# Patient Record
Sex: Male | Born: 1990 | Race: White | Hispanic: No | Marital: Single | Smoking: Current some day smoker
Health system: Southern US, Community
[De-identification: ages and names within clinical notes are randomized; demographics above are authoritative.]

## PROBLEM LIST (undated history)

## (undated) DIAGNOSIS — G8929 Other chronic pain: Secondary | ICD-10-CM

## (undated) DIAGNOSIS — M549 Dorsalgia, unspecified: Secondary | ICD-10-CM

## (undated) DIAGNOSIS — M419 Scoliosis, unspecified: Secondary | ICD-10-CM

## (undated) DIAGNOSIS — G009 Bacterial meningitis, unspecified: Secondary | ICD-10-CM

---

## 2013-09-06 ENCOUNTER — Emergency Department (HOSPITAL_BASED_OUTPATIENT_CLINIC_OR_DEPARTMENT_OTHER)
Admission: EM | Admit: 2013-09-06 | Discharge: 2013-09-07 | Disposition: A | Discharge status: Home / Self Care | Payer: BC Managed Care – PPO | Attending: Emergency Medicine | Admitting: Emergency Medicine

## 2013-09-06 ENCOUNTER — Encounter (HOSPITAL_BASED_OUTPATIENT_CLINIC_OR_DEPARTMENT_OTHER): Payer: Self-pay | Admitting: Emergency Medicine

## 2013-09-06 DIAGNOSIS — R5383 Other fatigue: Secondary | ICD-10-CM

## 2013-09-06 DIAGNOSIS — F17200 Nicotine dependence, unspecified, uncomplicated: Secondary | ICD-10-CM | POA: Insufficient documentation

## 2013-09-06 DIAGNOSIS — G8929 Other chronic pain: Secondary | ICD-10-CM | POA: Insufficient documentation

## 2013-09-06 DIAGNOSIS — R5381 Other malaise: Secondary | ICD-10-CM | POA: Insufficient documentation

## 2013-09-06 DIAGNOSIS — R42 Dizziness and giddiness: Secondary | ICD-10-CM | POA: Insufficient documentation

## 2013-09-06 DIAGNOSIS — R630 Anorexia: Secondary | ICD-10-CM | POA: Insufficient documentation

## 2013-09-06 DIAGNOSIS — R0602 Shortness of breath: Secondary | ICD-10-CM | POA: Insufficient documentation

## 2013-09-06 DIAGNOSIS — H538 Other visual disturbances: Secondary | ICD-10-CM | POA: Insufficient documentation

## 2013-09-06 DIAGNOSIS — Z880 Allergy status to penicillin: Secondary | ICD-10-CM | POA: Insufficient documentation

## 2013-09-06 DIAGNOSIS — G4489 Other headache syndrome: Secondary | ICD-10-CM | POA: Insufficient documentation

## 2013-09-06 DIAGNOSIS — R55 Syncope and collapse: Secondary | ICD-10-CM | POA: Insufficient documentation

## 2013-09-06 HISTORY — DX: Scoliosis, unspecified: M41.9

## 2013-09-06 HISTORY — DX: Other chronic pain: G89.29

## 2013-09-06 HISTORY — DX: Dorsalgia, unspecified: M54.9

## 2013-09-06 LAB — URINALYSIS, ROUTINE W REFLEX MICROSCOPIC
Bilirubin Urine: NEGATIVE
GLUCOSE, UA: NEGATIVE mg/dL
HGB URINE DIPSTICK: NEGATIVE
Ketones, ur: NEGATIVE mg/dL
Leukocytes, UA: NEGATIVE
Nitrite: NEGATIVE
PH: 6 (ref 5.0–8.0)
PROTEIN: NEGATIVE mg/dL
Specific Gravity, Urine: 1.027 (ref 1.005–1.030)
Urobilinogen, UA: 0.2 mg/dL (ref 0.0–1.0)

## 2013-09-06 NOTE — ED Provider Notes (Signed)
CSN: [id number]     Arrival date & time 09/06/13  2105 History   None    This chart was scribed for Joya Gaskins, MD by Arlan Organ, ED Scribe. This patient was seen in room MH07/MH07 and the patient's care was started 11:02 PM.   Chief Complaint  Patient presents with  . Fatigue  . Near Syncope   The history is provided by the patient. No language interpreter was used.    HPI Comments: Calvin Ray is a 23 y.o. male with a PMHx of chronic back pain and scoliosis who presents to the Emergency Department complaining of fatigue onset today. He reports near syncope while at work earlier today. Pt also mentions an ongoing HA that is baseline for him, foggy vision, mild SOB, "black out spells",  lightheadedness, and weakness. Pt states "i feel like my body is out of place". States "it feels like I am on LSD but im not". Last known well this morning around 9 AM. He denies any hallucinations. No fever. Pt admits to smoking Marijuana. Last use yesterday. He denies any other illicit drug use. Pt with known allergy to Amoxicillin. No other concerns this visit.  Past Medical History  Diagnosis Date  . Scoliosis   . Chronic back pain    History reviewed. No pertinent past surgical history. History reviewed. No pertinent family history. History  Substance Use Topics  . Smoking status: Current Some Day Smoker  . Smokeless tobacco: Not on file  . Alcohol Use: No    Review of Systems  Constitutional: Positive for fatigue. Negative for fever.  Eyes: Positive for visual disturbance.  Respiratory: Positive for shortness of breath.   Neurological: Positive for dizziness, syncope, weakness, light-headedness and headaches.  Psychiatric/Behavioral: Negative for hallucinations.  All other systems reviewed and are negative.     Allergies  Amoxicillin  Home Medications   Prior to Admission medications   Not on File   Triage Vitals: BP 116/68  Pulse 77  Temp(Src) 98.3 F (36.8 C)  (Oral)  Resp 16  Wt 210 lb (95.255 kg)  SpO2 100%   Physical Exam  CONSTITUTIONAL: Well developed/well nourished HEAD: Normocephalic/atraumatic EYES: EOMI/PERRL ENMT: Mucous membranes moist NECK: supple no meningeal signs SPINE:entire spine nontender CV: S1/S2 noted, no murmurs/rubs/gallops noted LUNGS: Lungs are clear to auscultation bilaterally, no apparent distress ABDOMEN: soft, nontender, no rebound or guarding GU:no cva tenderness NEURO: Pt is awake/alert, moves all extremitiesx4. No arm or leg drift, no facial droop. Pt is ambulatory.  Speech is fluent.  No ataxia.  He answers all questions appropriately EXTREMITIES: pulses normal, full ROM SKIN: warm, color normal PSYCH: no abnormalities of mood noted   ED Course  Procedures  DIAGNOSTIC STUDIES: Oxygen Saturation is 100% on RA, Normal by my interpretation.    COORDINATION OF CARE: 11:02 PM- Will order urinalysis. Discussed treatment plan with pt at bedside and pt agreed to plan.    pt well appearing.  He had multiple complaints, and he feels it may stem from his THC use.  I advised to stop all drug use.  He has no focal neuro  Deficits, he is ambulatory without difficulty I also advised need to perform further labs (CBC/BMP) due to fatigue, but he refuses He was given outpatient referral He reports he has chronic numbness in his hands/feet that are not new and reports extensive workup previously without clear diagnosis   Labs Review Labs Reviewed  URINALYSIS, ROUTINE W REFLEX MICROSCOPIC    Imaging [id number]  Ct Head Wo Contrast  09/07/2013   CLINICAL DATA:  headache  EXAM: CT HEAD WITHOUT CONTRAST  TECHNIQUE: Contiguous axial images were obtained from the base of the skull through the vertex without intravenous contrast.  COMPARISON:  None.  FINDINGS: Mild atrophy. There is no evidence of acute intracranial hemorrhage, brain edema, mass lesion, acute infarction, mass effect, or midline shift. Acute infarct may be  inapparent on noncontrast CT. No other intra-axial abnormalities are seen, and the ventricles and sulci are within normal limits in size and symmetry. No abnormal extra-axial fluid collections or masses are identified. No significant calvarial abnormality.  IMPRESSION: 1. Negative for bleed or other acute intracranial process.   Electronically Signed   By: Oley Balm M.D.   On: 09/07/2013 00:17     EKG Interpretation   Date/Time:  Thursday September 06 2013 23:16:15 EDT Ventricular Rate:  61 PR Interval:  138 QRS Duration: 96 QT Interval:  420 QTC Calculation: 422 R Axis:   79 Text Interpretation:  Normal sinus rhythm with sinus arrhythmia Normal ECG  No previous ECGs available Confirmed by Bebe Shaggy  MD, Dorinda Hill ([id number]) on  09/06/2013 11:55:49 PM      MDM   Final diagnoses:  Other fatigue  Near syncope  Other headache syndrome    Nursing notes including past medical history and social history reviewed and considered in documentation Labs/vital reviewed and considered   I personally performed the services described in this documentation, which was scribed in my presence. The recorded information has been reviewed and is accurate.    Joya Gaskins, MD 09/07/13 [phone]

## 2013-09-06 NOTE — ED Notes (Signed)
Patient states that he has felt "foggy" today and just tired. Almost passed out at work earlier.

## 2013-09-06 NOTE — ED Notes (Signed)
Pt states feels like in a fog, near syncope several times today,   Feels numb

## 2013-09-07 ENCOUNTER — Emergency Department (HOSPITAL_BASED_OUTPATIENT_CLINIC_OR_DEPARTMENT_OTHER): Payer: BC Managed Care – PPO

## 2013-09-07 NOTE — Discharge Instructions (Signed)

## 2014-03-01 ENCOUNTER — Encounter (HOSPITAL_COMMUNITY): Payer: Self-pay | Admitting: Emergency Medicine

## 2014-03-01 ENCOUNTER — Emergency Department (HOSPITAL_COMMUNITY)
Admission: EM | Admit: 2014-03-01 | Discharge: 2014-03-02 | Disposition: A | Discharge status: Home / Self Care | Payer: Self-pay | Attending: Emergency Medicine | Admitting: Emergency Medicine

## 2014-03-01 DIAGNOSIS — Z720 Tobacco use: Secondary | ICD-10-CM | POA: Insufficient documentation

## 2014-03-01 DIAGNOSIS — R509 Fever, unspecified: Secondary | ICD-10-CM | POA: Insufficient documentation

## 2014-03-01 DIAGNOSIS — K088 Other specified disorders of teeth and supporting structures: Secondary | ICD-10-CM | POA: Insufficient documentation

## 2014-03-01 DIAGNOSIS — M791 Myalgia, unspecified site: Secondary | ICD-10-CM

## 2014-03-01 DIAGNOSIS — G8929 Other chronic pain: Secondary | ICD-10-CM | POA: Insufficient documentation

## 2014-03-01 DIAGNOSIS — K0889 Other specified disorders of teeth and supporting structures: Secondary | ICD-10-CM

## 2014-03-01 DIAGNOSIS — Z880 Allergy status to penicillin: Secondary | ICD-10-CM | POA: Insufficient documentation

## 2014-03-01 DIAGNOSIS — R197 Diarrhea, unspecified: Secondary | ICD-10-CM

## 2014-03-01 DIAGNOSIS — R1110 Vomiting, unspecified: Secondary | ICD-10-CM

## 2014-03-01 DIAGNOSIS — R112 Nausea with vomiting, unspecified: Secondary | ICD-10-CM | POA: Insufficient documentation

## 2014-03-01 HISTORY — DX: Bacterial meningitis, unspecified: G00.9

## 2014-03-01 LAB — I-STAT CHEM 8, ED
BUN: 21 mg/dL (ref 6–23)
CHLORIDE: 105 mmol/L (ref 96–112)
CREATININE: 0.8 mg/dL (ref 0.50–1.35)
Calcium, Ion: 1.18 mmol/L (ref 1.12–1.23)
Glucose, Bld: 94 mg/dL (ref 70–99)
HCT: 53 % — ABNORMAL HIGH (ref 39.0–52.0)
Hemoglobin: 18 g/dL — ABNORMAL HIGH (ref 13.0–17.0)
Potassium: 3.7 mmol/L (ref 3.5–5.1)
Sodium: 141 mmol/L (ref 135–145)
TCO2: 22 mmol/L (ref 0–100)

## 2014-03-01 LAB — COMPREHENSIVE METABOLIC PANEL
ALK PHOS: 52 U/L (ref 39–117)
ALT: 16 U/L (ref 0–53)
ANION GAP: 8 (ref 5–15)
AST: 23 U/L (ref 0–37)
Albumin: 5 g/dL (ref 3.5–5.2)
BILIRUBIN TOTAL: 1.7 mg/dL — AB (ref 0.3–1.2)
BUN: 18 mg/dL (ref 6–23)
CALCIUM: 9.6 mg/dL (ref 8.4–10.5)
CO2: 25 mmol/L (ref 19–32)
Chloride: 108 mmol/L (ref 96–112)
Creatinine, Ser: 1 mg/dL (ref 0.50–1.35)
GLUCOSE: 96 mg/dL (ref 70–99)
Potassium: 3.8 mmol/L (ref 3.5–5.1)
Sodium: 141 mmol/L (ref 135–145)
Total Protein: 8.3 g/dL (ref 6.0–8.3)

## 2014-03-01 LAB — CBC WITH DIFFERENTIAL/PLATELET
BASOS ABS: 0 10*3/uL (ref 0.0–0.1)
BASOS PCT: 0 % (ref 0–1)
Eosinophils Absolute: 0.1 10*3/uL (ref 0.0–0.7)
Eosinophils Relative: 1 % (ref 0–5)
HCT: 49.2 % (ref 39.0–52.0)
HEMOGLOBIN: 16.9 g/dL (ref 13.0–17.0)
Lymphocytes Relative: 8 % — ABNORMAL LOW (ref 12–46)
Lymphs Abs: 0.9 10*3/uL (ref 0.7–4.0)
MCH: 31.2 pg (ref 26.0–34.0)
MCHC: 34.3 g/dL (ref 30.0–36.0)
MCV: 90.9 fL (ref 78.0–100.0)
MONO ABS: 0.5 10*3/uL (ref 0.1–1.0)
Monocytes Relative: 4 % (ref 3–12)
NEUTROS PCT: 87 % — AB (ref 43–77)
Neutro Abs: 10.6 10*3/uL — ABNORMAL HIGH (ref 1.7–7.7)
Platelets: 216 10*3/uL (ref 150–400)
RBC: 5.41 MIL/uL (ref 4.22–5.81)
RDW: 12.8 % (ref 11.5–15.5)
WBC: 12.2 10*3/uL — ABNORMAL HIGH (ref 4.0–10.5)

## 2014-03-01 LAB — I-STAT CG4 LACTIC ACID, ED: LACTIC ACID, VENOUS: 1.25 mmol/L (ref 0.5–2.0)

## 2014-03-01 LAB — LIPASE, BLOOD: LIPASE: 19 U/L (ref 11–59)

## 2014-03-01 MED ORDER — PROMETHAZINE HCL 25 MG PO TABS: 25.0000 mg | ORAL_TABLET | Freq: Four times a day (QID) | ORAL | Status: DC | PRN

## 2014-03-01 MED ORDER — KETOROLAC TROMETHAMINE 15 MG/ML IJ SOLN
15.0000 mg | Freq: Once | INTRAMUSCULAR | Status: AC
  Administered 2014-03-01: 15 mg via INTRAVENOUS
  Filled 2014-03-01: qty 1

## 2014-03-01 MED ORDER — SODIUM CHLORIDE 0.9 % IV BOLUS (SEPSIS)
1000.0000 mL | Freq: Once | INTRAVENOUS | Status: AC
  Administered 2014-03-01: 1000 mL via INTRAVENOUS

## 2014-03-01 MED ORDER — MORPHINE SULFATE 4 MG/ML IJ SOLN
4.0000 mg | Freq: Once | INTRAMUSCULAR | Status: AC
  Administered 2014-03-01: 4 mg via INTRAVENOUS
  Filled 2014-03-01: qty 1

## 2014-03-01 MED ORDER — FAMOTIDINE IN NACL 20-0.9 MG/50ML-% IV SOLN
20.0000 mg | Freq: Once | INTRAVENOUS | Status: AC
  Administered 2014-03-01: 20 mg via INTRAVENOUS
  Filled 2014-03-01: qty 50

## 2014-03-01 MED ORDER — OXYCODONE-ACETAMINOPHEN 5-325 MG PO TABS
ORAL_TABLET | ORAL | Status: AC
Start: 2014-03-01 — End: ?

## 2014-03-01 MED ORDER — ONDANSETRON HCL 4 MG/2ML IJ SOLN
4.0000 mg | Freq: Once | INTRAMUSCULAR | Status: AC
  Administered 2014-03-01: 4 mg via INTRAVENOUS
  Filled 2014-03-01: qty 2

## 2014-03-01 MED ORDER — PROMETHAZINE HCL 25 MG/ML IJ SOLN
25.0000 mg | Freq: Once | INTRAMUSCULAR | Status: AC
  Administered 2014-03-01: 25 mg via INTRAVENOUS
  Filled 2014-03-01: qty 1

## 2014-03-01 NOTE — ED Notes (Signed)
Patient given ginger ale to drink for fluid challenge.  Provider at the bedside.

## 2014-03-01 NOTE — Discharge Instructions (Signed)
Return to the emergency room for any worsening or concerning symptoms including fast breathing, heart racing, confusion, vomiting.  Rest, cover your mouth when you cough and wash your hands frequently.   Push fluids: water or Gatorade, do not drink any soda, juice or caffeinated beverages.  For fever and pain control you can take Motrin (ibuprofen) as follows: 400 mg (this is normally 2 over the counter pills) every 4 hours with food.  Do not return to work until a day after your fever breaks.   Take percocet for cough and pain control, do not drink alcohol, drive, care for children or do other critical tasks while taking percocet.   Push fluids: take small frequent sips of water or Gatorade, do not drink any soda, juice or caffeinated beverages.    Slowly resume solid diet as desired. Avoid food that are spicy, contain dairy and/or have high fat content.  Do not hesitate to return to the emergency room for any new, worsening or concerning symptoms.  Please obtain primary care using resource guide below. But the minute you were seen in the emergency room and that they will need to obtain records for further outpatient management.

## 2014-03-01 NOTE — ED Notes (Signed)
Nurse drawing labs. 

## 2014-03-01 NOTE — ED Notes (Signed)
Pt c/o emesis all day today, body aches, chills, HA. Pt states he has had a tooth that is broken hurting for several days.

## 2014-03-01 NOTE — ED Provider Notes (Signed)
CSN: [id number]     Arrival date & time 03/01/14  1912 History   First MD Initiated Contact with Patient 03/01/14 1927     Chief Complaint  Patient presents with  . Emesis  . Dental Pain     (Consider location/radiation/quality/duration/timing/severity/associated sxs/prior Treatment) HPI   Calvin Ray is a 24 y.o. male complaining of significantly worsening pain from left upper tooth with significant carries that is cracking x2 days. Patient has been taking 800 mg of ibuprofen every 4-6 hours for pain. He started vomiting at noon today. He's had approximately 6 episodes, it is nonbloody, nonbilious but states he can taste blood he reports mild epigastric abdominal pain onset after the emesis, associated symptoms of normally colored diarrhea. Patient denies sick contacts, cough, chest pain, rhinorrhea, sore throat. On review of systems he endorses a mild global headache  Past Medical History  Diagnosis Date  . Scoliosis   . Chronic back pain   . Bacterial meningitis    History reviewed. No pertinent past surgical history. No family history on file. History  Substance Use Topics  . Smoking status: Current Some Day Smoker  . Smokeless tobacco: Not on file  . Alcohol Use: Yes     Comment: occ    Review of Systems  10 systems reviewed and found to be negative, except as noted in the HPI.  Allergies  Amoxicillin  Home Medications   Prior to Admission medications   Medication Sig Start Date End Date Taking? Authorizing Provider  ibuprofen (ADVIL,MOTRIN) 200 MG tablet Take 800 mg by mouth every 6 (six) hours as needed for moderate pain (tooth pain).   Yes Historical Provider, MD  oxyCODONE-acetaminophen (PERCOCET/ROXICET) 5-325 MG per tablet 1 to 2 tabs PO q6hrs  PRN for pain 03/01/14   Joni ReiningNicole Isa Hitz, PA-C  promethazine (PHENERGAN) 25 MG tablet Take 1 tablet (25 mg total) by mouth every 6 (six) hours as needed for nausea or vomiting. 03/01/14   Joni ReiningNicole Sicily Zaragoza, PA-C   BP  99/60 mmHg  Pulse 103  Temp(Src) 97.7 F (36.5 C) (Oral)  Resp 16  Ht 5\' 11"  (1.803 m)  Wt 192 lb (87.091 kg)  BMI 26.79 kg/m2  SpO2 97% Physical Exam  Constitutional: He is oriented to person, place, and time. He appears well-developed and well-nourished. No distress.  HENT:  Head: Normocephalic.  Mouth/Throat: Oropharynx is clear and moist.  Generally poor dentition, no gingival swelling, erythema or tenderness to palpation. Patient is handling their secretions. There is no tenderness to palpation or firmness underneath tongue bilaterally. No trismus.    Eyes: Conjunctivae and EOM are normal. Pupils are equal, round, and reactive to light.  Neck: Normal range of motion.  Cardiovascular: Regular rhythm and intact distal pulses.   Mild tachycardia  Pulmonary/Chest: Effort normal and breath sounds normal. No stridor. No respiratory distress. He has no wheezes. He has no rales. He exhibits no tenderness.  Abdominal: Soft. Bowel sounds are normal. He exhibits no distension and no mass. There is no tenderness. There is no rebound and no guarding.  Musculoskeletal: Normal range of motion.  Neurological: He is alert and oriented to person, place, and time.  Psychiatric: He has a normal mood and affect.  Nursing note and vitals reviewed.   ED Course  Procedures (including critical care time) Labs Review Labs Reviewed  COMPREHENSIVE METABOLIC PANEL - Abnormal; Notable for the following:    Total Bilirubin 1.7 (*)    All other components within normal limits  CBC WITH DIFFERENTIAL/PLATELET -  Abnormal; Notable for the following:    WBC 12.2 (*)    Neutrophils Relative % 87 (*)    Neutro Abs 10.6 (*)    Lymphocytes Relative 8 (*)    All other components within normal limits  I-STAT CHEM 8, ED - Abnormal; Notable for the following:    Hemoglobin 18.0 (*)    HCT 53.0 (*)    All other components within normal limits  LIPASE, BLOOD  I-STAT CG4 LACTIC ACID, ED    Imaging Review No  results found.   EKG Interpretation None      MDM   Final diagnoses:  Vomiting and diarrhea  Fever, unspecified fever cause  Myalgia  Pain, dental    Filed Vitals:   03/01/14 2100 03/01/14 2156 03/01/14 2200 03/01/14 2202  BP: 106/75 101/51 99/60   Pulse: 103 102 103   Temp:    97.7 F (36.5 C)  TempSrc:    Oral  Resp:      Height:      Weight:      SpO2: 100% 98% 97%     Medications  sodium chloride 0.9 % bolus 1,000 mL (0 mLs Intravenous Stopped 03/01/14 2058)  morphine 4 MG/ML injection 4 mg (4 mg Intravenous Given 03/01/14 1959)  ondansetron (ZOFRAN) injection 4 mg (4 mg Intravenous Given 03/01/14 1958)  famotidine (PEPCID) IVPB 20 mg (0 mg Intravenous Stopped 03/01/14 2058)  sodium chloride 0.9 % bolus 1,000 mL (0 mLs Intravenous Stopped 03/01/14 2258)  ketorolac (TORADOL) 15 MG/ML injection 15 mg (15 mg Intravenous Given 03/01/14 2149)  promethazine (PHENERGAN) injection 25 mg (25 mg Intravenous Given 03/01/14 2149)    Calvin Ray is a pleasant 24 y.o. male presenting with worsening dental pain secondary to significant carries the roads into the gumline. No sign of overt abscess or infection. Patient is afebrile, mildly tachycardic. He has vomiting and diarrhea, this may be secondary to viral gastroenteritis versus accidental overdose on ibuprofen. Plan is IV fluids, basic blood work. Patient will be given Pepcid and Zofran. Offered him a dental nerve block but he has declined.  Blood work reassuring with mild leukocytosis of 12.2, no electrolyte abnormalities. Creatinine is not significantly elevated knee has a normal lactic acid. Serial abdominal exams remain benign. Patient is hydrated and tachycardia has improved. He is tolerating by mouth. Patient spiked a fever in the ED which responded well to Toradol.  Evaluation does not show pathology that would require ongoing emergent intervention or inpatient treatment. Pt is hemodynamically stable and mentating  appropriately. Discussed findings and plan with patient/guardian, who agrees with care plan. All questions answered. Return precautions discussed and outpatient follow up given.   New Prescriptions   OXYCODONE-ACETAMINOPHEN (PERCOCET/ROXICET) 5-325 MG PER TABLET    1 to 2 tabs PO q6hrs  PRN for pain   PROMETHAZINE (PHENERGAN) 25 MG TABLET    Take 1 tablet (25 mg total) by mouth every 6 (six) hours as needed for nausea or vomiting.         Wynetta Emery, PA-C 03/01/14 2344  Linwood Dibbles, MD 03/01/14 [phone]

## 2014-03-01 NOTE — ED Notes (Signed)
Awaiting patient's ride.

## 2014-03-01 NOTE — ED Notes (Signed)
Patient denies pain and is resting comfortably.  

## 2014-03-04 ENCOUNTER — Encounter (HOSPITAL_BASED_OUTPATIENT_CLINIC_OR_DEPARTMENT_OTHER): Payer: Self-pay | Admitting: Emergency Medicine

## 2014-03-05 ENCOUNTER — Encounter (HOSPITAL_COMMUNITY): Payer: Self-pay | Admitting: *Deleted

## 2014-03-05 ENCOUNTER — Emergency Department (HOSPITAL_COMMUNITY)
Admission: EM | Admit: 2014-03-05 | Discharge: 2014-03-05 | Disposition: A | Discharge status: Home / Self Care | Payer: Self-pay | Attending: Emergency Medicine | Admitting: Emergency Medicine

## 2014-03-05 DIAGNOSIS — G8929 Other chronic pain: Secondary | ICD-10-CM | POA: Insufficient documentation

## 2014-03-05 DIAGNOSIS — R197 Diarrhea, unspecified: Secondary | ICD-10-CM | POA: Insufficient documentation

## 2014-03-05 DIAGNOSIS — Z720 Tobacco use: Secondary | ICD-10-CM | POA: Insufficient documentation

## 2014-03-05 DIAGNOSIS — Z880 Allergy status to penicillin: Secondary | ICD-10-CM | POA: Insufficient documentation

## 2014-03-05 DIAGNOSIS — R112 Nausea with vomiting, unspecified: Secondary | ICD-10-CM | POA: Insufficient documentation

## 2014-03-05 DIAGNOSIS — M419 Scoliosis, unspecified: Secondary | ICD-10-CM | POA: Insufficient documentation

## 2014-03-05 DIAGNOSIS — Z8619 Personal history of other infectious and parasitic diseases: Secondary | ICD-10-CM | POA: Insufficient documentation

## 2014-03-05 LAB — URINALYSIS, ROUTINE W REFLEX MICROSCOPIC
BILIRUBIN URINE: NEGATIVE
Glucose, UA: NEGATIVE mg/dL
Hgb urine dipstick: NEGATIVE
KETONES UR: NEGATIVE mg/dL
Leukocytes, UA: NEGATIVE
NITRITE: NEGATIVE
PH: 6 (ref 5.0–8.0)
Protein, ur: NEGATIVE mg/dL
SPECIFIC GRAVITY, URINE: 1.02 (ref 1.005–1.030)
UROBILINOGEN UA: 0.2 mg/dL (ref 0.0–1.0)

## 2014-03-05 LAB — CBC WITH DIFFERENTIAL/PLATELET
Basophils Absolute: 0 10*3/uL (ref 0.0–0.1)
Basophils Relative: 1 % (ref 0–1)
Eosinophils Absolute: 0.2 10*3/uL (ref 0.0–0.7)
Eosinophils Relative: 4 % (ref 0–5)
HCT: 45.8 % (ref 39.0–52.0)
Hemoglobin: 15.5 g/dL (ref 13.0–17.0)
LYMPHS ABS: 2 10*3/uL (ref 0.7–4.0)
LYMPHS PCT: 32 % (ref 12–46)
MCH: 31.1 pg (ref 26.0–34.0)
MCHC: 33.8 g/dL (ref 30.0–36.0)
MCV: 91.8 fL (ref 78.0–100.0)
MONOS PCT: 11 % (ref 3–12)
Monocytes Absolute: 0.7 10*3/uL (ref 0.1–1.0)
NEUTROS ABS: 3.4 10*3/uL (ref 1.7–7.7)
NEUTROS PCT: 54 % (ref 43–77)
Platelets: 211 10*3/uL (ref 150–400)
RBC: 4.99 MIL/uL (ref 4.22–5.81)
RDW: 12.7 % (ref 11.5–15.5)
WBC: 6.4 10*3/uL (ref 4.0–10.5)

## 2014-03-05 LAB — COMPREHENSIVE METABOLIC PANEL
ALT: 14 U/L (ref 0–53)
AST: 17 U/L (ref 0–37)
Albumin: 4.5 g/dL (ref 3.5–5.2)
Alkaline Phosphatase: 41 U/L (ref 39–117)
Anion gap: 8 (ref 5–15)
BILIRUBIN TOTAL: 0.8 mg/dL (ref 0.3–1.2)
BUN: 14 mg/dL (ref 6–23)
CHLORIDE: 103 mmol/L (ref 96–112)
CO2: 25 mmol/L (ref 19–32)
CREATININE: 0.91 mg/dL (ref 0.50–1.35)
Calcium: 9.2 mg/dL (ref 8.4–10.5)
GFR calc Af Amer: >90 mL/min (ref >90)
Glucose, Bld: 97 mg/dL (ref 70–99)
Potassium: 4.1 mmol/L (ref 3.5–5.1)
Sodium: 136 mmol/L (ref 135–145)
Total Protein: 7.3 g/dL (ref 6.0–8.3)

## 2014-03-05 LAB — LIPASE, BLOOD: LIPASE: 19 U/L (ref 11–59)

## 2014-03-05 MED ORDER — DICYCLOMINE HCL 20 MG PO TABS: 20.0000 mg | ORAL_TABLET | Freq: Three times a day (TID) | ORAL | Status: AC

## 2014-03-05 MED ORDER — LOPERAMIDE HCL 2 MG PO CAPS: 2.0000 mg | ORAL_CAPSULE | Freq: Four times a day (QID) | ORAL | Status: AC | PRN

## 2014-03-05 MED ORDER — ONDANSETRON HCL 4 MG/2ML IJ SOLN
4.0000 mg | Freq: Once | INTRAMUSCULAR | Status: AC
  Administered 2014-03-05: 4 mg via INTRAVENOUS
  Filled 2014-03-05: qty 2

## 2014-03-05 MED ORDER — TRAMADOL HCL 50 MG PO TABS
50.0000 mg | ORAL_TABLET | Freq: Four times a day (QID) | ORAL | Status: AC | PRN
Start: 2014-03-05 — End: ?

## 2014-03-05 MED ORDER — MORPHINE SULFATE 4 MG/ML IJ SOLN
4.0000 mg | Freq: Once | INTRAMUSCULAR | Status: AC
  Administered 2014-03-05: 4 mg via INTRAVENOUS
  Filled 2014-03-05: qty 1

## 2014-03-05 MED ORDER — DICYCLOMINE HCL 10 MG/ML IM SOLN
20.0000 mg | Freq: Once | INTRAMUSCULAR | Status: AC
  Administered 2014-03-05: 20 mg via INTRAMUSCULAR
  Filled 2014-03-05: qty 2

## 2014-03-05 MED ORDER — KETOROLAC TROMETHAMINE 30 MG/ML IJ SOLN
30.0000 mg | Freq: Once | INTRAMUSCULAR | Status: AC
  Administered 2014-03-05: 30 mg via INTRAVENOUS
  Filled 2014-03-05: qty 1

## 2014-03-05 MED ORDER — SODIUM CHLORIDE 0.9 % IV BOLUS (SEPSIS)
1000.0000 mL | Freq: Once | INTRAVENOUS | Status: AC
  Administered 2014-03-05: 1000 mL via INTRAVENOUS

## 2014-03-05 MED ORDER — PROMETHAZINE HCL 25 MG PO TABS: 25.0000 mg | ORAL_TABLET | Freq: Four times a day (QID) | ORAL | Status: AC | PRN

## 2014-03-05 MED ORDER — LOPERAMIDE HCL 2 MG PO CAPS
2.0000 mg | ORAL_CAPSULE | Freq: Once | ORAL | Status: AC
  Administered 2014-03-05: 2 mg via ORAL
  Filled 2014-03-05: qty 1

## 2014-03-05 NOTE — ED Provider Notes (Signed)
TIME SEEN: 7:30 AM  CHIEF COMPLAINT: Nausea, vomiting, diarrhea  HPI: Pt is a 24 y.o. male with no significant past Joni Reining history who presents to the emergency department with complaints of nausea, vomiting, diarrhea for the past 3-4 days. Was seen 03/01/14 for fever and body aches. Thought that he may have had a dental abscess. At that time he did have some nausea and vomiting but states it was mild. States it is progressively worsening and he is having worsening abdominal cramps today. No sick contacts or recent travel. No prior history of abdominal surgery. No recent hospitalization or antibiotic use. No bloody stool or melena. No dysuria or hematuria. Denies flank pain despite nursing triage notes. States he is having diffuse moderate crampy abdominal pain that radiates into his lower back. No aggravating or relieving factors.  ROS: See HPI Constitutional:  fever  Eyes: no drainage  ENT: no runny nose   Cardiovascular:  no chest pain  Resp: no SOB  GI:  vomiting GU: no dysuria Integumentary: no rash  Allergy: no hives  Musculoskeletal: no leg swelling  Neurological: no slurred speech ROS otherwise negative  PAST MEDICAL HISTORY/PAST SURGICAL HISTORY:  Past Medical History  Diagnosis Date  . Scoliosis   . Chronic back pain   . Bacterial meningitis     MEDICATIONS:  Prior to Admission medications   Medication Sig Start Date End Date Taking? Authorizing Provider  ibuprofen (ADVIL,MOTRIN) 200 MG tablet Take 800 mg by mouth every 6 (six) hours as needed for moderate pain (tooth pain).    Historical Provider, MD  oxyCODONE-acetaminophen (PERCOCET/ROXICET) 5-325 MG per tablet 1 to 2 tabs PO q6hrs  PRN for pain 03/01/14   Joni Reining Pisciotta, PA-C  promethazine (PHENERGAN) 25 MG tablet Take 1 tablet (25 mg total) by mouth every 6 (six) hours as needed for nausea or vomiting. 03/01/14   Wynetta Emery, PA-C    ALLERGIES:  Allergies  Allergen Reactions  . Amoxicillin Anaphylaxis     SOCIAL HISTORY:  History  Substance Use Topics  . Smoking status: Current Some Day Smoker  . Smokeless tobacco: Not on file  . Alcohol Use: Yes     Comment: occ    FAMILY HISTORY: No family history on file.  EXAM: BP 106/54 mmHg  Pulse 68  Temp(Src) 98.2 F (36.8 C) (Oral)  Resp 16  Ht  (1.803 m)  Wt 192 lb (87.091 kg)  BMI 26.79 kg/m2  SpO2 99% CONSTITUTIONAL: Alert and oriented and responds appropriately to questions. Well-appearing; well-nourished, nontoxic-appearing, in no distress HEAD: Normocephalic EYES: Conjunctivae clear, PERRL ENT: normal nose; no rhinorrhea; dry mucous membranes; pharynx without lesions noted NECK: Supple, no meningismus, no LAD  CARD: RRR; S1 and S2 appreciated; no murmurs, no clicks, no rubs, no gallops RESP: Normal chest excursion without splinting or tachypnea; breath sounds clear and equal bilaterally; no wheezes, no rhonchi, no rales,  ABD/GI: Normal bowel sounds; non-distended; soft, non-tender, no rebound, no guarding, negative Murphy sign, no tenderness at McBurney's point, no peritoneal signs BACK:  The back appears normal and is non-tender to palpation, there is no CVA tenderness EXT: Normal ROM in all joints; non-tender to palpation; no edema; normal capillary refill; no cyanosis    SKIN: Normal color for age and race; warm NEURO: Moves all extremities equally PSYCH: The patient's mood and manner are appropriate. Grooming and personal hygiene are appropriate.  MEDICAL DECISION MAKING: Patient here with likely viral gastroenteritis. Abdominal exam is benign. I do not feel he needs  abdominal imaging at this time. On February 26 had labs which were unremarkable other than a leukocytosis of 12.2. We'll repeat labs and urine today. We'll give IV fluids, Toradol, Zofran, Imodium, Bentyl and reassess.  ED PROGRESS: Patient reports some improvement in symptoms. No vomiting in the ED. His abdominal exam is still benign. He has  completely normal labs and urine. No leukocytosis. No elevated LFTs or lipase. Girlfriend is now at bedside requesting a CT scan. Discussed with girlfriend that given his benign exam, normal vitals, normal labs and urine but I have low suspicion for any significant intra-abdominal process and I feel this is likely viral in nature. Discussed with her that the risk of radiation exposure with a CT scan far outweigh the benefits especially when I think a CT will be normal. She is comfortable with this plan. We'll discharge home with prescriptions for Bentyl, Phenergan, Imodium, tramadol. Discussed return precautions. He verbalizes understanding and is comfortable with plan.     Layla MawKristen N Ward, DO 03/05/14 [phone]

## 2014-03-05 NOTE — ED Notes (Signed)
Pt reports seen here on Friday for flu like symptoms, states now having left side flank pain. Continues to have pain and vomiting.

## 2014-03-05 NOTE — Discharge Instructions (Signed)

## 2015-08-06 IMAGING — CT CT HEAD W/O CM
2 series · 16 of 30 positions shown, 18 images · non-contrast
Comparison: None.

CLINICAL DATA: headache

EXAM:
CT HEAD WITHOUT CONTRAST
TECHNIQUE: Contiguous axial images were obtained from the base of the skull
through the vertex without intravenous contrast.

[Series 2: head 4.8 h37s · axial · 0.46mm/px · z∈[+1071,+1188]mm · 8 of 32 slices shown, 10 images]
[im 4/32  brain]
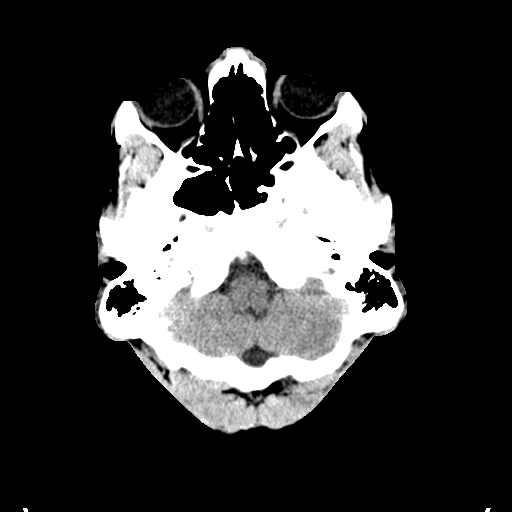
[im 4/32  bone]
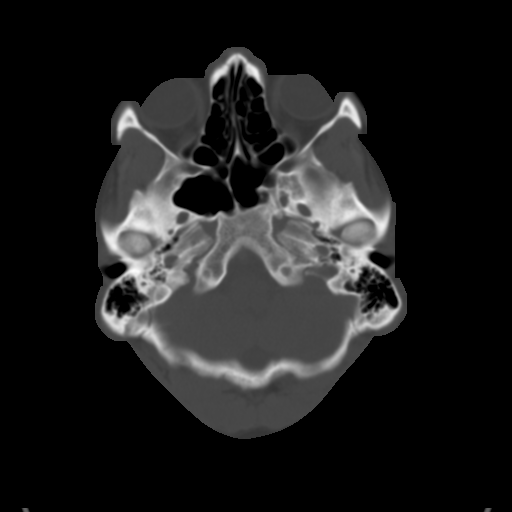
[im 7/32  brain]
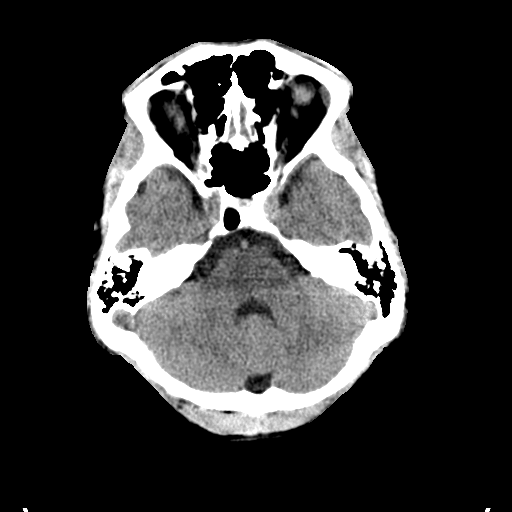
[im 11/32  brain]
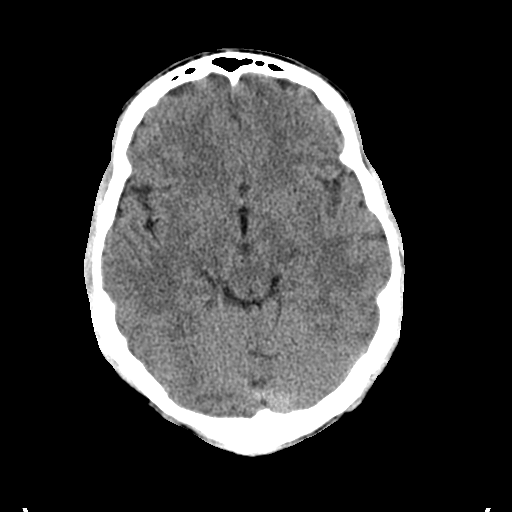
[im 14/32  brain]
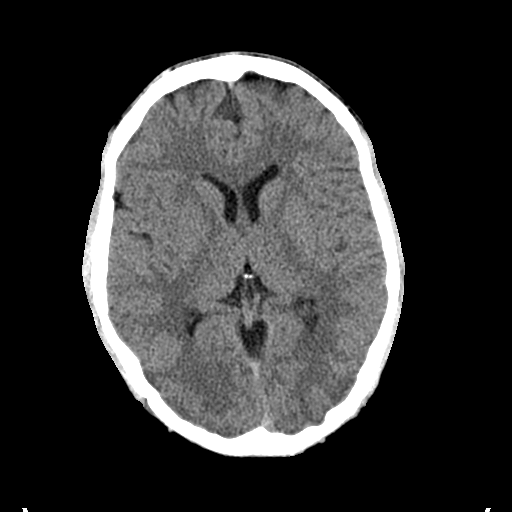
[im 18/32  brain]
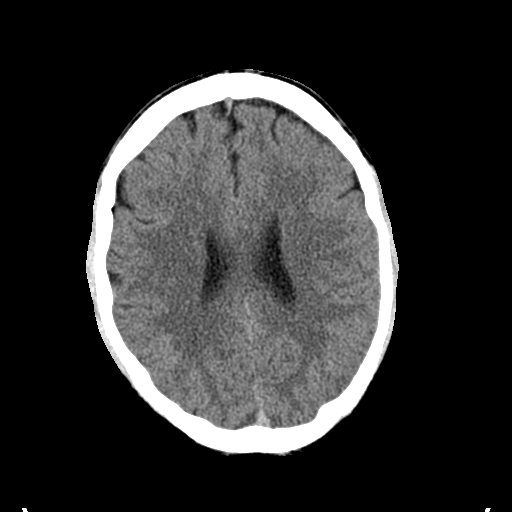
[im 18/32  bone]
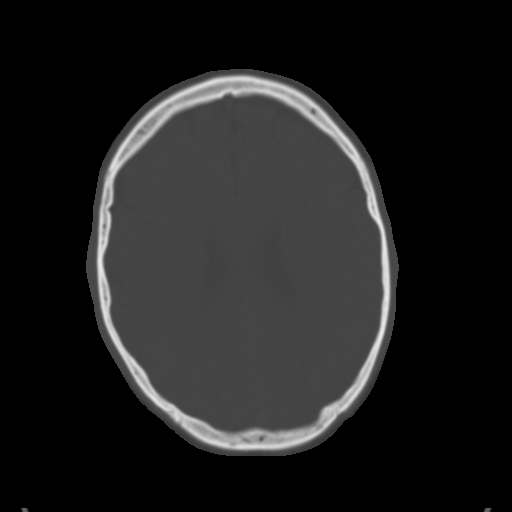
[im 21/32  brain]
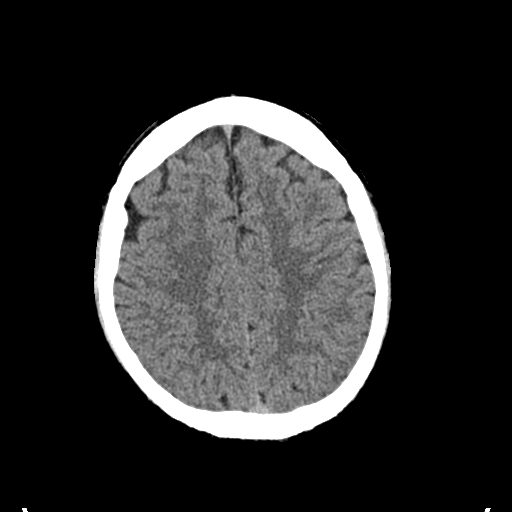
[im 25/32  brain]
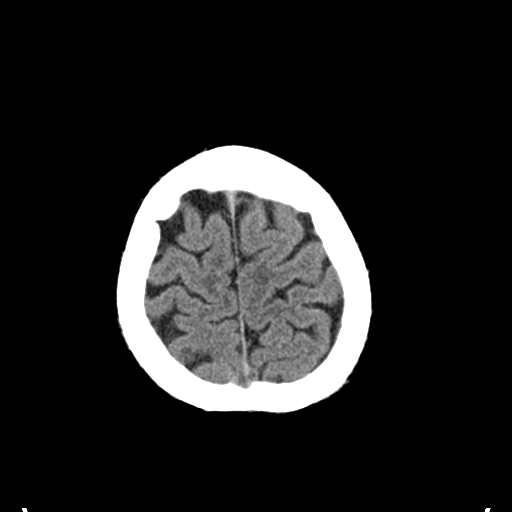
[im 28/32  brain]
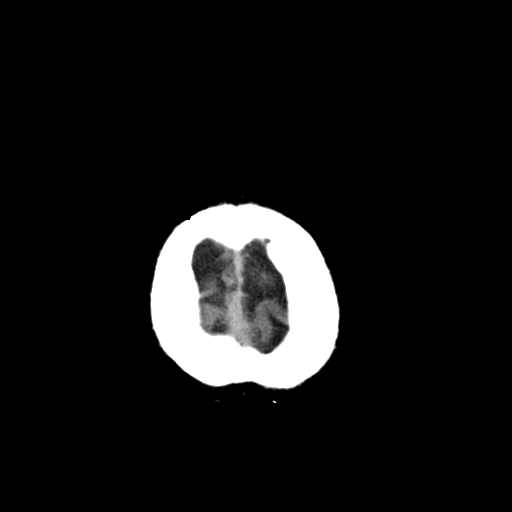

[Series 3: head 2.4 h60s bone · axial · 0.46mm/px · z∈[+1069,+1191]mm · 8 of 64 slices shown]
[im 7/64  bone]
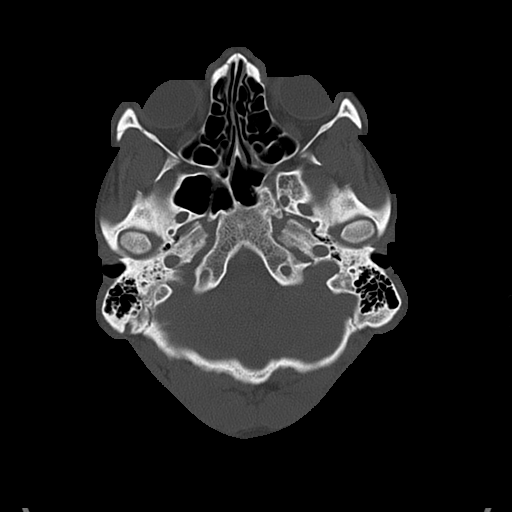
[im 14/64  bone]
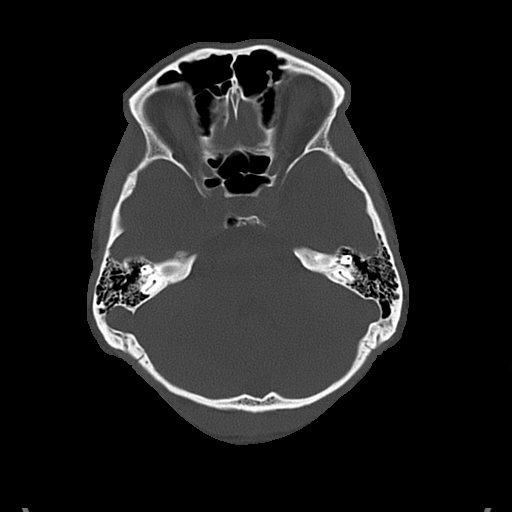
[im 20/64  bone]
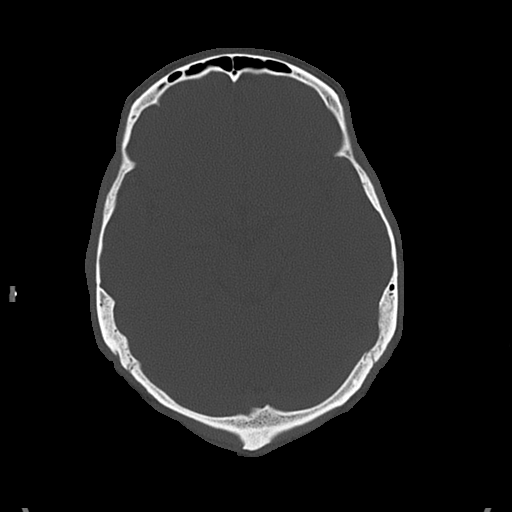
[im 27/64  bone]
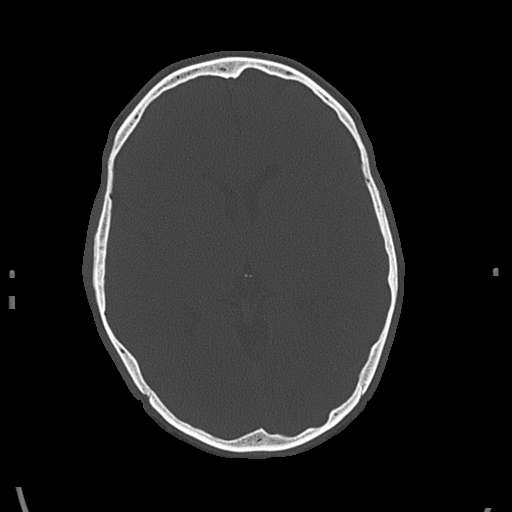
[im 37/64  bone]
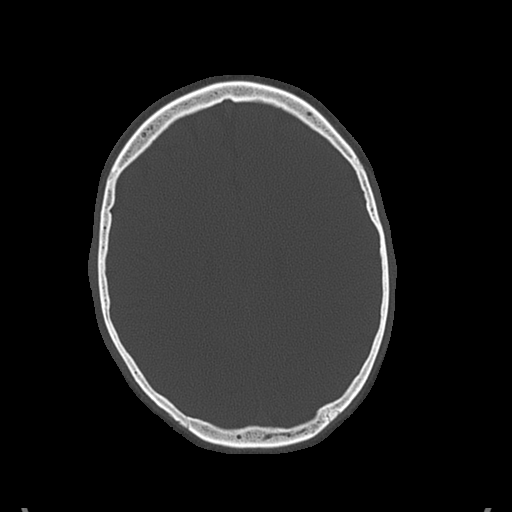
[im 44/64  bone]
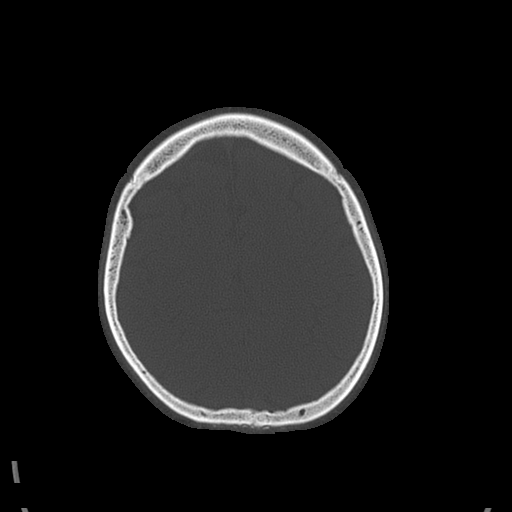
[im 50/64  bone]
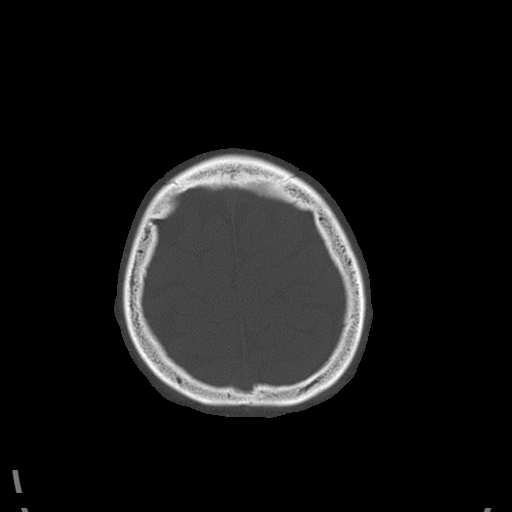
[im 57/64  bone]
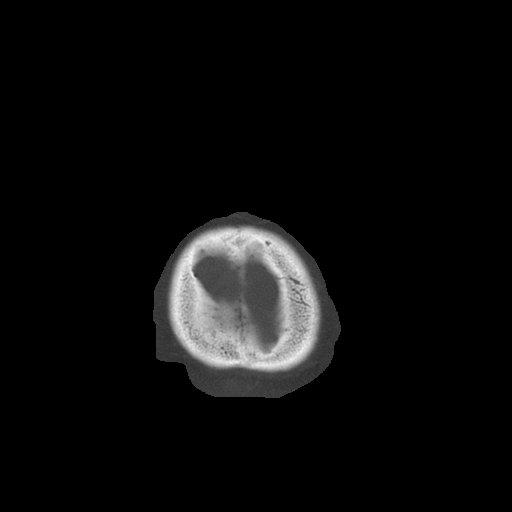

[16 of 30 positions shown; findings below may reference images not displayed]

FINDINGS: Mild atrophy. There is no evidence of acute intracranial hemorrhage,
brain edema, mass lesion, acute infarction, mass effect, or midline
shift. Acute infarct may be inapparent on noncontrast CT. No other
intra-axial abnormalities are seen, and the ventricles and sulci are
within normal limits in size and symmetry. No abnormal extra-axial
fluid collections or masses are identified. No significant calvarial
abnormality.
IMPRESSION: 1. Negative for bleed or other acute intracranial process.
